# Patient Record
Sex: Male | Born: 1960 | Race: Black or African American | Hispanic: No | Marital: Married | Smoking: Current every day smoker
Health system: Southern US, Community
[De-identification: ages and names within clinical notes are randomized; demographics above are authoritative.]

## PROBLEM LIST (undated history)

## (undated) DIAGNOSIS — I10 Essential (primary) hypertension: Secondary | ICD-10-CM

## (undated) DIAGNOSIS — F419 Anxiety disorder, unspecified: Secondary | ICD-10-CM

---

## 2002-09-28 ENCOUNTER — Emergency Department (HOSPITAL_COMMUNITY): Admission: EM | Admit: 2002-09-28 | Discharge: 2002-09-28 | Payer: Self-pay | Admitting: Emergency Medicine

## 2003-07-05 ENCOUNTER — Emergency Department (HOSPITAL_COMMUNITY): Admission: EM | Admit: 2003-07-05 | Discharge: 2003-07-05 | Payer: Self-pay | Admitting: Emergency Medicine

## 2007-05-08 ENCOUNTER — Emergency Department (HOSPITAL_COMMUNITY): Admission: EM | Admit: 2007-05-08 | Discharge: 2007-05-08 | Payer: Self-pay | Admitting: Emergency Medicine

## 2008-10-13 ENCOUNTER — Emergency Department (HOSPITAL_COMMUNITY): Admission: EM | Admit: 2008-10-13 | Discharge: 2008-10-13 | Payer: Self-pay | Admitting: *Deleted

## 2008-11-14 ENCOUNTER — Emergency Department (HOSPITAL_COMMUNITY): Admission: EM | Admit: 2008-11-14 | Discharge: 2008-11-14 | Payer: Self-pay | Admitting: Emergency Medicine

## 2009-02-14 ENCOUNTER — Emergency Department (HOSPITAL_COMMUNITY): Admission: EM | Admit: 2009-02-14 | Discharge: 2009-02-14 | Payer: Self-pay | Admitting: Family Medicine

## 2011-10-01 LAB — BASIC METABOLIC PANEL
BUN: 8
Calcium: 9.5
Creatinine, Ser: 1.1
GFR calc Af Amer: >60
GFR calc non Af Amer: >60
Glucose, Bld: 104 — ABNORMAL HIGH
Potassium: 3.9

## 2011-10-01 LAB — DIFFERENTIAL
Lymphocytes Relative: 29
Monocytes Absolute: 0.4
Monocytes Relative: 7
Neutro Abs: 3

## 2011-10-01 LAB — CBC
HCT: 46
Hemoglobin: 15.3
RBC: 5.1
RDW: 13.4

## 2014-01-06 ENCOUNTER — Emergency Department (HOSPITAL_COMMUNITY): Admission: EM | Admit: 2014-01-06 | Discharge: 2014-01-06 | Discharge status: Against Medical Advice | Payer: Self-pay

## 2014-10-18 ENCOUNTER — Emergency Department (HOSPITAL_COMMUNITY): Payer: Commercial Managed Care - PPO

## 2014-10-18 ENCOUNTER — Emergency Department (HOSPITAL_COMMUNITY)
Admission: EM | Admit: 2014-10-18 | Discharge: 2014-10-18 | Disposition: A | Discharge status: Home / Self Care | Payer: Commercial Managed Care - PPO | Attending: Emergency Medicine | Admitting: Emergency Medicine

## 2014-10-18 ENCOUNTER — Encounter (HOSPITAL_COMMUNITY): Payer: Self-pay | Admitting: Emergency Medicine

## 2014-10-18 DIAGNOSIS — I10 Essential (primary) hypertension: Secondary | ICD-10-CM | POA: Insufficient documentation

## 2014-10-18 DIAGNOSIS — M542 Cervicalgia: Secondary | ICD-10-CM | POA: Diagnosis not present

## 2014-10-18 DIAGNOSIS — Z720 Tobacco use: Secondary | ICD-10-CM | POA: Diagnosis not present

## 2014-10-18 DIAGNOSIS — M5410 Radiculopathy, site unspecified: Secondary | ICD-10-CM | POA: Diagnosis not present

## 2014-10-18 DIAGNOSIS — R200 Anesthesia of skin: Secondary | ICD-10-CM | POA: Diagnosis present

## 2014-10-18 DIAGNOSIS — Z79899 Other long term (current) drug therapy: Secondary | ICD-10-CM | POA: Insufficient documentation

## 2014-10-18 HISTORY — DX: Essential (primary) hypertension: I10

## 2014-10-18 LAB — COMPREHENSIVE METABOLIC PANEL
ALK PHOS: 75 U/L (ref 39–117)
ALT: 21 U/L (ref 0–53)
AST: 20 U/L (ref 0–37)
Albumin: 4.2 g/dL (ref 3.5–5.2)
Anion gap: 14 (ref 5–15)
BILIRUBIN TOTAL: 0.3 mg/dL (ref 0.3–1.2)
BUN: 12 mg/dL (ref 6–23)
CO2: 26 mEq/L (ref 19–32)
Calcium: 10.2 mg/dL (ref 8.4–10.5)
Chloride: 101 mEq/L (ref 96–112)
Creatinine, Ser: 1.19 mg/dL (ref 0.50–1.35)
GFR calc Af Amer: 78 mL/min — ABNORMAL LOW (ref >90)
GFR, EST NON AFRICAN AMERICAN: 68 mL/min — AB (ref >90)
GLUCOSE: 117 mg/dL — AB (ref 70–99)
POTASSIUM: 4.1 meq/L (ref 3.7–5.3)
Sodium: 141 mEq/L (ref 137–147)
TOTAL PROTEIN: 8.1 g/dL (ref 6.0–8.3)

## 2014-10-18 LAB — CBC WITH DIFFERENTIAL/PLATELET
Basophils Absolute: 0 10*3/uL (ref 0.0–0.1)
Basophils Relative: 0 % (ref 0–1)
Eosinophils Absolute: 0.3 10*3/uL (ref 0.0–0.7)
Eosinophils Relative: 6 % — ABNORMAL HIGH (ref 0–5)
HEMATOCRIT: 46.4 % (ref 39.0–52.0)
HEMOGLOBIN: 15.5 g/dL (ref 13.0–17.0)
Lymphocytes Relative: 27 % (ref 12–46)
Lymphs Abs: 1.6 10*3/uL (ref 0.7–4.0)
MCH: 30 pg (ref 26.0–34.0)
MCHC: 33.4 g/dL (ref 30.0–36.0)
MCV: 89.9 fL (ref 78.0–100.0)
MONOS PCT: 7 % (ref 3–12)
Monocytes Absolute: 0.4 10*3/uL (ref 0.1–1.0)
NEUTROS PCT: 60 % (ref 43–77)
Neutro Abs: 3.4 10*3/uL (ref 1.7–7.7)
PLATELETS: 272 10*3/uL (ref 150–400)
RBC: 5.16 MIL/uL (ref 4.22–5.81)
RDW: 13.5 % (ref 11.5–15.5)
WBC: 5.7 10*3/uL (ref 4.0–10.5)

## 2014-10-18 LAB — I-STAT TROPONIN, ED: Troponin i, poc: 0.01 ng/mL (ref 0.00–0.08)

## 2014-10-18 LAB — TROPONIN I

## 2014-10-18 MED ORDER — HYDROCODONE-ACETAMINOPHEN 5-325 MG PO TABS: 2.0000 | ORAL_TABLET | ORAL | Status: AC | PRN

## 2014-10-18 MED ORDER — PREDNISONE 10 MG PO TABS: ORAL_TABLET | ORAL | Status: AC

## 2014-10-18 NOTE — ED Provider Notes (Signed)
Medical screening examination/treatment/procedure(s) were performed by non-physician practitioner and as supervising physician I was immediately available for consultation/collaboration.   EKG Interpretation None      Devoria AlbeIva Rahn Lacuesta, MD, Armando GangFACEP   Ward GivensIva L Josey Dettmann, MD 10/18/14 [id number]

## 2014-10-18 NOTE — Discharge Instructions (Signed)

## 2014-10-18 NOTE — ED Provider Notes (Signed)
CSN: [id number]     Arrival date & time 10/18/14  1002 History   First MD Initiated Contact with Patient 10/18/14 1116     Chief Complaint  Patient presents with  . Numbness     (Consider location/radiation/quality/duration/timing/severity/associated sxs/prior Treatment) Patient is a 53 y.o. male presenting with neurologic complaint. The history is provided by the patient. No language interpreter was used.  Neurologic Problem This is a new problem. The current episode started today. The problem occurs constantly. The problem has been unchanged. Associated symptoms include neck pain and numbness. Pertinent negatives include no diaphoresis, fatigue, headaches, nausea, vertigo, visual change, vomiting or weakness. Nothing aggravates the symptoms. He has tried nothing for the symptoms. The treatment provided no relief.   Pt had numbness to left arm a week ago that resolved.   Pt reports he thought he slept wrong.   Pt reports he noticed tingling and numbness to right arm last pm.   Pt reports he had trouble sleeping last night.  Pt reports some tightness in neck.  No weakness, Past Medical History  Diagnosis Date  . Hypertension    History reviewed. No pertinent past surgical history. History reviewed. No pertinent family history. History  Substance Use Topics  . Smoking status: Current Every Day Smoker  . Smokeless tobacco: Not on file  . Alcohol Use: No    Review of Systems  Constitutional: Negative for diaphoresis and fatigue.  Gastrointestinal: Negative for nausea and vomiting.  Musculoskeletal: Positive for neck pain.  Neurological: Positive for numbness. Negative for vertigo, weakness and headaches.  All other systems reviewed and are negative.     Allergies  Review of patient's allergies indicates no known allergies.  Home Medications   Prior to Admission medications   Medication Sig Start Date End Date Taking? Authorizing Provider  amLODipine (NORVASC) 10 MG tablet  Take 10 mg by mouth daily.   Yes Historical Provider, MD  hydrochlorothiazide (HYDRODIURIL) 25 MG tablet Take 25 mg by mouth daily.   Yes Historical Provider, MD  ibuprofen (ADVIL,MOTRIN) 200 MG tablet Take 600 mg by mouth every 6 (six) hours as needed for headache.   Yes Historical Provider, MD  losartan (COZAAR) 50 MG tablet Take 50 mg by mouth daily.   Yes Historical Provider, MD  lovastatin (MEVACOR) 40 MG tablet Take 40 mg by mouth at bedtime.   Yes Historical Provider, MD   BP 133/82  Pulse 66  Temp(Src) 97.5 F (36.4 C) (Oral)  Resp 18  Ht 5\' 11"  (1.803 m)  Wt 260 lb (117.935 kg)  BMI 36.28 kg/m2  SpO2 95% Physical Exam  Nursing note and vitals reviewed. Constitutional: He is oriented to person, place, and time. He appears well-developed and well-nourished.  HENT:  Head: Normocephalic.  Right Ear: External ear normal.  Left Ear: External ear normal.  Nose: Nose normal.  Mouth/Throat: Oropharynx is clear and moist.  Eyes: Conjunctivae and EOM are normal. Pupils are equal, round, and reactive to light.  Neck: Normal range of motion. Neck supple.  Cardiovascular: Normal rate and normal heart sounds.   Pulmonary/Chest: Effort normal and breath sounds normal.  Abdominal: Soft.  Musculoskeletal: Normal range of motion.  Tingling in area of shoulder upper arm to mid forearm normal strength  Neurological: He is alert and oriented to person, place, and time. He has normal reflexes.  Skin: Skin is warm.  Psychiatric: He has a normal mood and affect.    ED Course  Procedures (including critical care time) Labs  Review Labs Reviewed  CBC WITH DIFFERENTIAL - Abnormal; Notable for the following:    Eosinophils Relative 6 (*)    All other components within normal limits  COMPREHENSIVE METABOLIC PANEL - Abnormal; Notable for the following:    Glucose, Bld 117 (*)    GFR calc non Af Amer 68 (*)    GFR calc Af Amer 78 (*)    All other components within normal limits  TROPONIN I   I-STAT TROPOININ, ED    Imaging Review Dg Chest 2 View  10/18/2014   CLINICAL DATA:  Two-day history of right arm numbness ; history of hypertension and diabetes  EXAM: CHEST  2 VIEW  COMPARISON:  Portable chest x-ray of October 13, 2008.  FINDINGS: The lungs are adequately inflated. There is no focal infiltrate. The cardiac silhouette is top-normal in size. The pulmonary vascularity is not engorged. The mediastinum is normal in width. There is no pleural effusion or pneumothorax. The observed bony thorax is unremarkable.  IMPRESSION: There is no acute cardiopulmonary abnormality.   Electronically Signed   By: David  [place]   On: 10/18/2014 11:20   Ct Head Wo Contrast  10/18/2014   CLINICAL DATA:  Onset of right-sided arm numbness last night.  EXAM: CT HEAD WITHOUT CONTRAST  CT CERVICAL SPINE WITHOUT CONTRAST  TECHNIQUE: Multidetector CT imaging of the head and cervical spine was performed following the standard protocol without intravenous contrast. Multiplanar CT image reconstructions of the cervical spine were also generated.  COMPARISON:  None.  FINDINGS: CT HEAD FINDINGS  The ventricles are normal in size and configuration. No extra-axial fluid collections are identified. The gray-white differentiation is normal. No CT findings for acute intracranial process such as hemorrhage or infarction. No mass lesions. The brainstem and cerebellum are grossly normal.  The bony structures are intact. The paranasal sinuses and mastoid air cells are clear. The globes are intact.  CT CERVICAL SPINE FINDINGS  Exam is limited by body habitus and some motion artifact. Reversal of the normal cervical lordosis likely due to the head for deposition. Normal alignment of the cervical vertebral bodies. Disc spaces and vertebral bodies are maintained. Moderate degenerative disc disease and facet disease. The disc disease is most significant at C4-5, C5-6 and C6-7 and the facet disease is more prevalent in the upper cervical  spine. No acute fracture or abnormal prevertebral soft tissue swelling. The facets are normally aligned. The skullbase C1 and C1-2 articulations are maintained. The dens is normal. No large disc protrusions, spinal or foraminal stenosis. The lung apices are clear.  IMPRESSION: 1. No acute intracranial findings or mass lesion. 2. Degenerative cervical spondylosis with multilevel disc disease and facet disease but no acute fractures identified. 3. Mild right foraminal stenosis noted at C3-4 due to severe right-sided facet disease.   Electronically Signed   By: Loralie Champagne M.D.   On: 10/18/2014 13:56   Ct Cervical Spine Wo Contrast  10/18/2014   CLINICAL DATA:  Onset of right-sided arm numbness last night.  EXAM: CT HEAD WITHOUT CONTRAST  CT CERVICAL SPINE WITHOUT CONTRAST  TECHNIQUE: Multidetector CT imaging of the head and cervical spine was performed following the standard protocol without intravenous contrast. Multiplanar CT image reconstructions of the cervical spine were also generated.  COMPARISON:  None.  FINDINGS: CT HEAD FINDINGS  The ventricles are normal in size and configuration. No extra-axial fluid collections are identified. The gray-white differentiation is normal. No CT findings for acute intracranial process such as hemorrhage or infarction.  No mass lesions. The brainstem and cerebellum are grossly normal.  The bony structures are intact. The paranasal sinuses and mastoid air cells are clear. The globes are intact.  CT CERVICAL SPINE FINDINGS  Exam is limited by body habitus and some motion artifact. Reversal of the normal cervical lordosis likely due to the head for deposition. Normal alignment of the cervical vertebral bodies. Disc spaces and vertebral bodies are maintained. Moderate degenerative disc disease and facet disease. The disc disease is most significant at C4-5, C5-6 and C6-7 and the facet disease is more prevalent in the upper cervical spine. No acute fracture or abnormal  prevertebral soft tissue swelling. The facets are normally aligned. The skullbase C1 and C1-2 articulations are maintained. The dens is normal. No large disc protrusions, spinal or foraminal stenosis. The lung apices are clear.  IMPRESSION: 1. No acute intracranial findings or mass lesion. 2. Degenerative cervical spondylosis with multilevel disc disease and facet disease but no acute fractures identified. 3. Mild right foraminal stenosis noted at C3-4 due to severe right-sided facet disease.   Electronically Signed   By: Loralie ChampagneMark  Gallerani M.D.   On: 10/18/2014 13:56     EKG Interpretation None      MDM  Pt's symptoms sound like radiculopathy from his neck.    Ct head is normal   Ct spine shows foriminal stenosis at c3-4 level,  Which seems to be in the distribution of tingling.   I will treat with prednisone and hydrocodone.   Pt advised to see Dr. Gerlene FeeKritzer for evaluation.   Final diagnoses:  Radiculopathy of arm    Follow up with Dr. Gerlene FeeKritzer for recheck AVS    Elson AreasLeslie K Zachry Hopfensperger, PA-C 10/18/14 1514  Lonia SkinnerLeslie K Coon RapidsSofia, [place] JerseyPA-C 10/18/14 1515

## 2014-10-18 NOTE — ED Notes (Signed)
Spoke with CT stated patient is next to be transported.

## 2014-10-18 NOTE — ED Notes (Signed)
Pt in c/o right arm numbness and tingling, reports intermittent pain shooting down to his right hand, states this first happened two days ago and the symptoms at that time were in his left arm, they have since moved to right arm, denies chest pain, no distress noted.

## 2015-02-08 ENCOUNTER — Emergency Department (HOSPITAL_COMMUNITY)
Admission: EM | Admit: 2015-02-08 | Discharge: 2015-02-08 | Disposition: A | Discharge status: Home / Self Care | Payer: Commercial Managed Care - PPO | Attending: Emergency Medicine | Admitting: Emergency Medicine

## 2015-02-08 ENCOUNTER — Encounter (HOSPITAL_COMMUNITY): Payer: Self-pay

## 2015-02-08 DIAGNOSIS — E669 Obesity, unspecified: Secondary | ICD-10-CM | POA: Insufficient documentation

## 2015-02-08 DIAGNOSIS — R112 Nausea with vomiting, unspecified: Secondary | ICD-10-CM | POA: Insufficient documentation

## 2015-02-08 DIAGNOSIS — Z7952 Long term (current) use of systemic steroids: Secondary | ICD-10-CM | POA: Insufficient documentation

## 2015-02-08 DIAGNOSIS — H578 Other specified disorders of eye and adnexa: Secondary | ICD-10-CM | POA: Diagnosis not present

## 2015-02-08 DIAGNOSIS — R5383 Other fatigue: Secondary | ICD-10-CM | POA: Diagnosis not present

## 2015-02-08 DIAGNOSIS — R000 Tachycardia, unspecified: Secondary | ICD-10-CM | POA: Insufficient documentation

## 2015-02-08 DIAGNOSIS — R69 Illness, unspecified: Secondary | ICD-10-CM

## 2015-02-08 DIAGNOSIS — Z79899 Other long term (current) drug therapy: Secondary | ICD-10-CM | POA: Insufficient documentation

## 2015-02-08 DIAGNOSIS — R509 Fever, unspecified: Secondary | ICD-10-CM | POA: Insufficient documentation

## 2015-02-08 DIAGNOSIS — M791 Myalgia: Secondary | ICD-10-CM | POA: Diagnosis not present

## 2015-02-08 DIAGNOSIS — J111 Influenza due to unidentified influenza virus with other respiratory manifestations: Secondary | ICD-10-CM

## 2015-02-08 DIAGNOSIS — R197 Diarrhea, unspecified: Secondary | ICD-10-CM | POA: Insufficient documentation

## 2015-02-08 DIAGNOSIS — Z720 Tobacco use: Secondary | ICD-10-CM | POA: Diagnosis not present

## 2015-02-08 LAB — COMPREHENSIVE METABOLIC PANEL
ALK PHOS: 81 U/L (ref 39–117)
ALT: 21 U/L (ref 0–53)
AST: 26 U/L (ref 0–37)
Albumin: 4.2 g/dL (ref 3.5–5.2)
Anion gap: 12 (ref 5–15)
BUN: 8 mg/dL (ref 6–23)
CALCIUM: 8.9 mg/dL (ref 8.4–10.5)
CHLORIDE: 103 mmol/L (ref 96–112)
CO2: 26 mmol/L (ref 19–32)
Creatinine, Ser: 1.41 mg/dL — ABNORMAL HIGH (ref 0.50–1.35)
GFR calc Af Amer: 63 mL/min — ABNORMAL LOW (ref >90)
GFR, EST NON AFRICAN AMERICAN: 55 mL/min — AB (ref >90)
Glucose, Bld: 91 mg/dL (ref 70–99)
POTASSIUM: 3.5 mmol/L (ref 3.5–5.1)
SODIUM: 141 mmol/L (ref 135–145)
Total Bilirubin: 0.5 mg/dL (ref 0.3–1.2)
Total Protein: 7.7 g/dL (ref 6.0–8.3)

## 2015-02-08 LAB — CBC WITH DIFFERENTIAL/PLATELET
Basophils Absolute: 0 10*3/uL (ref 0.0–0.1)
Basophils Relative: 0 % (ref 0–1)
Eosinophils Absolute: 0.1 10*3/uL (ref 0.0–0.7)
Eosinophils Relative: 1 % (ref 0–5)
HEMATOCRIT: 45.8 % (ref 39.0–52.0)
HEMOGLOBIN: 15.5 g/dL (ref 13.0–17.0)
LYMPHS PCT: 8 % — AB (ref 12–46)
Lymphs Abs: 0.4 10*3/uL — ABNORMAL LOW (ref 0.7–4.0)
MCH: 30.1 pg (ref 26.0–34.0)
MCHC: 33.8 g/dL (ref 30.0–36.0)
MCV: 88.9 fL (ref 78.0–100.0)
MONO ABS: 0 10*3/uL — AB (ref 0.1–1.0)
MONOS PCT: 1 % — AB (ref 3–12)
NEUTROS ABS: 5.1 10*3/uL (ref 1.7–7.7)
Neutrophils Relative %: 90 % — ABNORMAL HIGH (ref 43–77)
Platelets: 195 10*3/uL (ref 150–400)
RBC: 5.15 MIL/uL (ref 4.22–5.81)
RDW: 13.3 % (ref 11.5–15.5)
WBC: 5.6 10*3/uL (ref 4.0–10.5)

## 2015-02-08 LAB — LIPASE, BLOOD: Lipase: 28 U/L (ref 11–59)

## 2015-02-08 MED ORDER — IBUPROFEN 400 MG PO TABS
600.0000 mg | ORAL_TABLET | Freq: Once | ORAL | Status: AC
  Administered 2015-02-08: 600 mg via ORAL
  Filled 2015-02-08 (×2): qty 1

## 2015-02-08 MED ORDER — ACETAMINOPHEN 325 MG PO TABS
650.0000 mg | ORAL_TABLET | Freq: Four times a day (QID) | ORAL | Status: DC | PRN
  Administered 2015-02-08: 650 mg via ORAL
  Filled 2015-02-08: qty 2

## 2015-02-08 MED ORDER — ONDANSETRON HCL 4 MG PO TABS: 4.0000 mg | ORAL_TABLET | Freq: Three times a day (TID) | ORAL | Status: AC | PRN

## 2015-02-08 MED ORDER — ONDANSETRON 4 MG PO TBDP
ORAL_TABLET | ORAL | Status: AC
  Filled 2015-02-08: qty 2

## 2015-02-08 MED ORDER — ONDANSETRON 4 MG PO TBDP
8.0000 mg | ORAL_TABLET | Freq: Once | ORAL | Status: AC
  Administered 2015-02-08: 8 mg via ORAL

## 2015-02-08 NOTE — ED Provider Notes (Signed)
CSN: [id number]     Arrival date & time 02/08/15  1250 History   First MD Initiated Contact with Patient 02/08/15 1526     Chief Complaint  Patient presents with  . Nausea  . Vomiting  . Diarrhea     (Consider location/radiation/quality/duration/timing/severity/associated sxs/prior Treatment) HPI Comments: Pt is a 54 y.o. male with nonsignficant PMH whom presents to the ED for 3 day history of generalized body aches, fever, nausea, vomiting and mild upper respiratory symptoms. Pt states he first noticed feeling off on Sunday after standing outside for a while and coming back in; no specific complaint other than feeling a little warm. He developed muscle aches that night which have continued. Monday evening he developed some nausea, and Tuesday had several episodes of watery diarrhea and vomiting. Diarrhea is not continuous, only had about 4 episodes, noted blood in first BM but says he has a hemorrhoid and no further bleeding after that. Vomit is food contents. He has some nausea but has been able to keep some foods/liquids down, hasn't drank much over the past 24 hours. He does not know any specific sick contacts, but states a coworker is currently waiting in the ER for abdominal pain and he sees a lot of people at his work. He did not get a flu shot this year. Since coming to the ED the zofran has helped his nausea, and he overall feels a little better, declining to have an IV placed or have more blood drawn.  Patient is a 54 y.o. male presenting with diarrhea. The history is provided by the patient. No language interpreter was used.  Diarrhea Quality:  Watery Severity:  Mild Onset quality:  Sudden Number of episodes:  4 Duration:  2 days Timing:  Sporadic Progression:  Unchanged Relieved by:  Nothing Worsened by:  Nothing tried Ineffective treatments:  None tried Associated symptoms: chills, fever, myalgias, URI and vomiting   Associated symptoms: no abdominal pain, no recent cough and  no headaches   Fever:    Duration:  2 days   Timing:  Intermittent   Max temp PTA (F):  102   Temp source:  Oral   Progression:  Unchanged Myalgias:    Location:  Generalized   Quality:  Aching   Onset quality:  Gradual   Duration:  2 days   Timing:  Constant   Progression:  Unchanged Vomiting:    Quality:  Stomach contents   Number of occurrences:  2   Severity:  Mild   Duration:  2 days   Timing:  Sporadic   Progression:  Unchanged Risk factors: sick contacts   Risk factors: no recent antibiotic use and no suspicious food intake     Past Medical History  Diagnosis Date  . Hypertension    History reviewed. No pertinent past surgical history. History reviewed. No pertinent family history. History  Substance Use Topics  . Smoking status: Current Every Day Smoker  . Smokeless tobacco: Not on file  . Alcohol Use: No    Review of Systems  Constitutional: Positive for fever, chills and fatigue.  HENT: Positive for rhinorrhea. Negative for sore throat.   Eyes: Negative for visual disturbance.  Respiratory: Negative for shortness of breath.   Cardiovascular: Negative for chest pain and leg swelling.  Gastrointestinal: Positive for nausea, vomiting and diarrhea. Negative for abdominal pain, constipation and blood in stool.  Genitourinary: Negative for dysuria and urgency.  Musculoskeletal: Positive for myalgias.  Skin: Negative for rash.  Neurological: Negative  for light-headedness and headaches.  Psychiatric/Behavioral: Negative for confusion.  All other systems reviewed and are negative.     Allergies  Review of patient's allergies indicates no known allergies.  Home Medications   Prior to Admission medications   Medication Sig Start Date End Date Taking? Authorizing Provider  amLODipine (NORVASC) 10 MG tablet Take 10 mg by mouth daily.   Yes Historical Provider, MD  hydrochlorothiazide (HYDRODIURIL) 25 MG tablet Take 25 mg by mouth daily.   Yes Historical  Provider, MD  ibuprofen (ADVIL,MOTRIN) 200 MG tablet Take 600 mg by mouth every 6 (six) hours as needed for headache.   Yes Historical Provider, MD  losartan (COZAAR) 50 MG tablet Take 50 mg by mouth daily.   Yes Historical Provider, MD  HYDROcodone-acetaminophen (NORCO/VICODIN) 5-325 MG per tablet Take 2 tablets by mouth every 4 (four) hours as needed. Patient not taking: Reported on 02/08/2015 10/18/14   Elson AreasLeslie K Sofia, PA-C  ondansetron (ZOFRAN) 4 MG tablet Take 1 tablet (4 mg total) by mouth every 8 (eight) hours as needed for nausea or vomiting. 02/08/15   Nani RavensAndrew M Francy Mcilvaine, MD  predniSONE (DELTASONE) 10 MG tablet 6,5,4,3,2,1 Patient not taking: Reported on 02/08/2015 10/18/14   Elson AreasLeslie K Sofia, PA-C   BP 149/93 mmHg  Pulse 122  Temp(Src) 102.1 F (38.9 C) (Oral)  Resp 18  SpO2 96% Physical Exam  Constitutional: He is oriented to person, place, and time. He appears well-developed and well-nourished. No distress.  HENT:  Head: Normocephalic.    Eyes: EOM are normal. Pupils are equal, round, and reactive to light. Right conjunctiva has a hemorrhage.    Neck: Normal range of motion.  Cardiovascular: Regular rhythm, normal heart sounds and intact distal pulses.  Tachycardia present.  Exam reveals no gallop and no friction rub.   No murmur heard. Pulmonary/Chest: Effort normal and breath sounds normal. No respiratory distress. He has no wheezes. He has no rales.  Abdominal: Soft. He exhibits no distension. There is no tenderness. There is no rebound and no guarding.  obese  Musculoskeletal: He exhibits no edema or tenderness.  Neurological: He is alert and oriented to person, place, and time. No cranial nerve deficit.  Skin: Skin is warm and dry. No rash noted.  Psychiatric: He has a normal mood and affect. His behavior is normal. Judgment and thought content normal.  Nursing note and vitals reviewed.   ED Course  Procedures (including critical care time) Labs Review Labs Reviewed    CBC WITH DIFFERENTIAL/PLATELET - Abnormal; Notable for the following:    Neutrophils Relative % 90 (*)    Lymphocytes Relative 8 (*)    Lymphs Abs 0.4 (*)    Monocytes Relative 1 (*)    Monocytes Absolute 0.0 (*)    All other components within normal limits  COMPREHENSIVE METABOLIC PANEL - Abnormal; Notable for the following:    Creatinine, Ser 1.41 (*)    GFR calc non Af Amer 55 (*)    GFR calc Af Amer 63 (*)    All other components within normal limits  LIPASE, BLOOD  URINALYSIS, ROUTINE W REFLEX MICROSCOPIC    Imaging Review No results found.   EKG Interpretation None      MDM   Final diagnoses:  Influenza-like illness   Pt overall appearing well despite having high fever 102F and tachycardia. Tachycardia could be explained by his fever and some dehydration, pt declines IV fluids but will try oral challenge. Mild AKI could also be explained by  dehydration. Likely viral infection, will forego additional workup at this time and pt wants to let it run its course, return precautions for worsening fever unrelieved by tylenol/motrin, cough or SOB.   Tachycardia improved after fever brought down, tolerated oral challenge. Stable for discharge.  Nani Ravens, MD 02/08/15 1652  Enid Skeens, MD 02/08/15 [phone]

## 2015-02-08 NOTE — ED Notes (Signed)
Pt refusing IV and blood work at this time

## 2015-02-08 NOTE — ED Notes (Signed)
Pt from home with nausea vomiting diarrhea since last night.  Emesis x 1, diarrhea x 4.   Chills reported at home.

## 2015-02-08 NOTE — Discharge Instructions (Signed)
You likely have a viral infection, which could be the flu or another virus. Continue to take tylenol and motrin to help with your fevers and any pain.   Tylenol 500mg  every 4 hours or 650mg  every 6 hours for fever Motrin/ibuprofen 600mg  every 6 hours for pain/fever  Make sure you drink plenty of water to stay hydrated.   No Primary Care Doctor:  Call [facility] at [phone] - they can help you locate a primary care doctor that accepts your insurance, provides certain services, etc.  Physician Referral [phone] Medication Assistance:  Organization Address Phone Notes  [facility]  [address]  [place], [place] [address]  [phone]  --Must be a resident of [facility] [place]  -- Must have NO insurance coverage whatsoever (no Medicaid/ Medicare, etc.)  -- The pt. MUST have a primary care doctor that directs their care regularly and follows them in the community   [facility]   [phone]    [employer]   [phone]    Agencies that provide inexpensive medical care:  Organization Address Phone Notes  [facility]   [phone]    [facility]   [phone]    [facility]  [address]  [place], [place] [address]  [phone]    [facility]  [address],  [place]  [phone]    [facility]   [phone]    [facility]   [phone]    [facility]  [address], [place]  Phone: [phone], Fax: [phone]  Hours of Operation: 9 am - 6 pm, M-F. Also accepts Medicaid/Medicare and self-pay.   [facility]  [address], [place]  Phone: [phone], Fax: [phone].  Hours of Operation: 8:30 am - 5:30 pm, M-F. Also accepts Medicaid and self-pay.   [facility] [place]  [address], [place]  Phone: [phone]) [phone]     [facility]  [address], [place], [place]  [phone], Ext. [phone]  Mondays & Thursdays: 7-9 AM. First 15 patients are seen on a first come, first serve basis.   Medicaid-accepting [facility] Providers:  Organization Address Phone Notes  [facility]  [address], [place]  [phone]) [phone]  Also accepts self-pay patients.   [facility]  [address], [place]  [phone]) [phone]    [facility]  [address], [place]  [phone]    [facility]  [address] [place] [address], [place]  [phone]    Renaye RakersVeita Bland  [address], [place]  [phone]  Only accepts [place] patients after they have their name applied to their card.   Self-Pay (no insurance) in [facility]:  Organization Address Phone Notes  [facility]  [address], [place]  [phone]    [facility]  [address], [place]  [phone]    [facility] [place]  [address], [place]  [phone]    [facility]/Dr. Osei-Bonsu  [address] [place] [address], [place] or [id number], [place]  [phone]  Phone  number for both [place] and [place] locations is the same.   [facility]  [address] [place]  [phone]    [facility]  [address], [place] or [address]  [phone]  [phone]    [facility]  [address] [place]  [phone], phone; [phone], fax   Sees patients 1st and 3rd Saturday of every month. Must not qualify for public or private insurance (i.e. Medicaid, Medicare, [place] [employer] Choice, Veterans' Benefits)   Household income should be no more than 200% of the poverty level The clinic cannot treat you if  you are pregnant or think you are pregnant  Sexually transmitted diseases are not treated at the clinic.

## 2016-01-08 IMAGING — CT CT CERVICAL SPINE W/O CM
4 of 5 series · 16 of 33 positions shown, 18 images · non-contrast
Comparison: None.

CLINICAL DATA: Onset of right-sided arm numbness last night.

EXAM:
CT HEAD WITHOUT CONTRAST
CT CERVICAL SPINE WITHOUT CONTRAST
TECHNIQUE: Multidetector CT imaging of the head and cervical spine was
performed following the standard protocol without intravenous
contrast. Multiplanar CT image reconstructions of the cervical spine
were also generated.

[Series 6: c_spine 2.0 i40s 3 · axial · 0.32mm/px · z∈[-344,-204]mm · 4 of 118 slices shown, 5 images]
[im 24/118  soft-tissue]
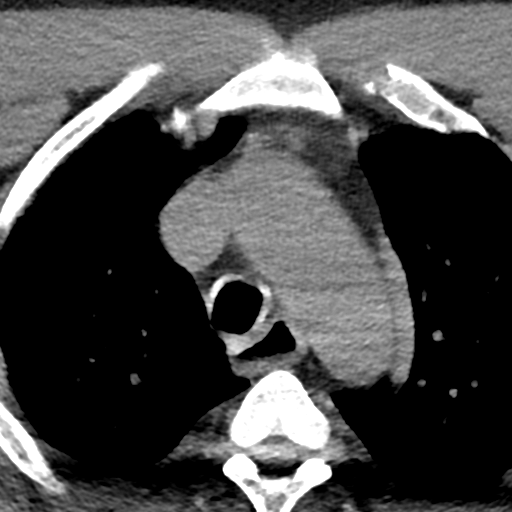
[im 24/118  bone]
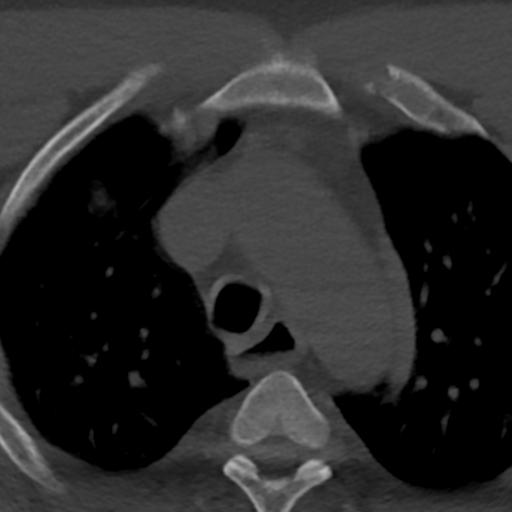
[im 47/118  bone]
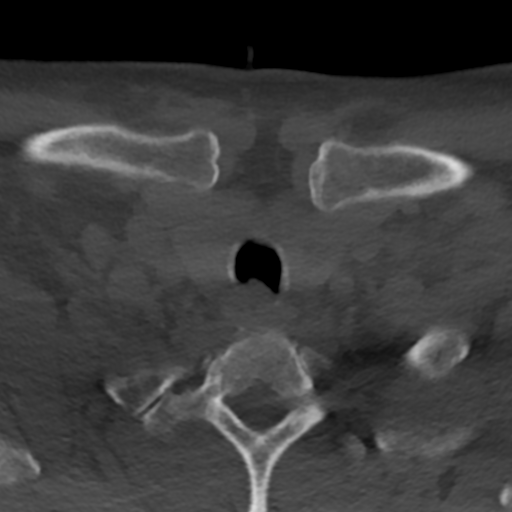
[im 71/118  bone]
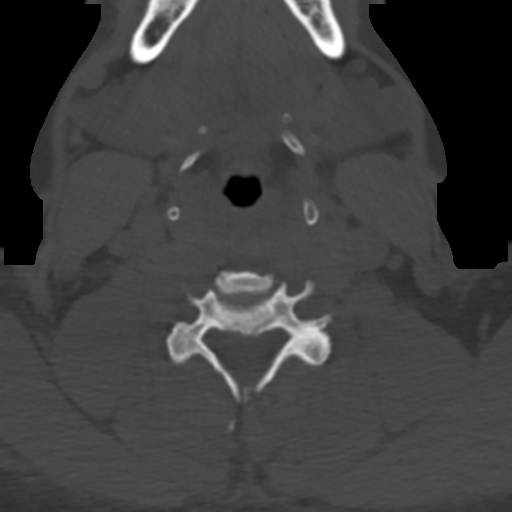
[im 94/118  bone]
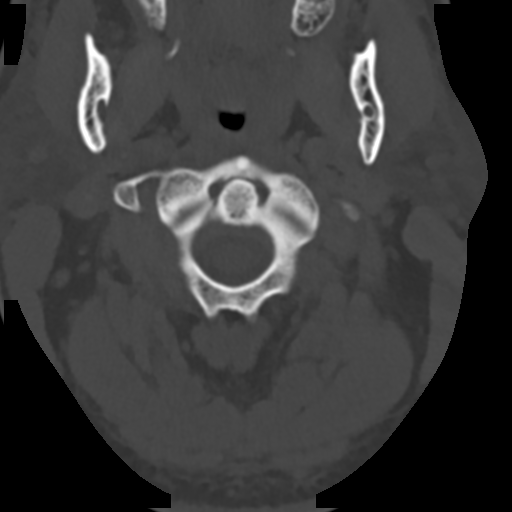

[Series 8: coronals · coronal · 0.37mm/px · 3 of 83 slices shown]
[im 17/83  bone]
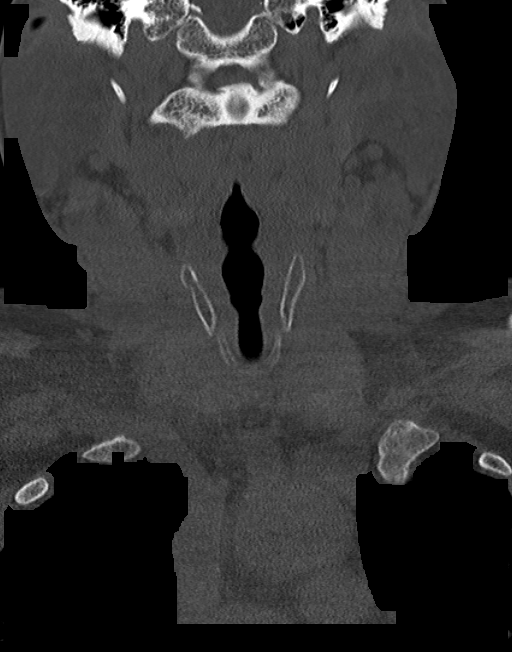
[im 33/83  bone]
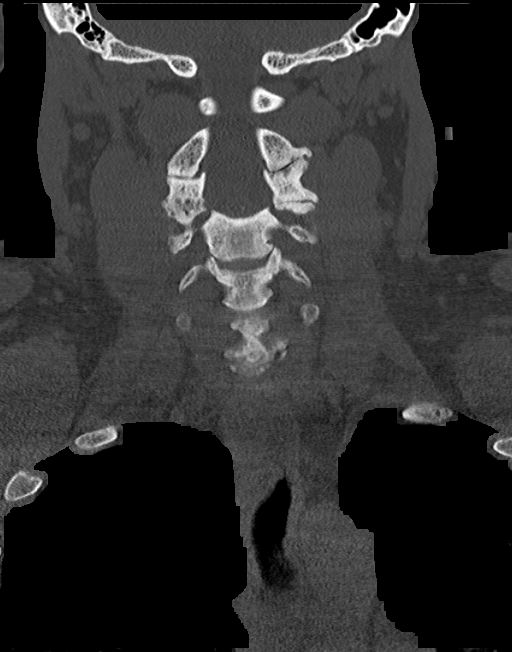
[im 50/83  bone]
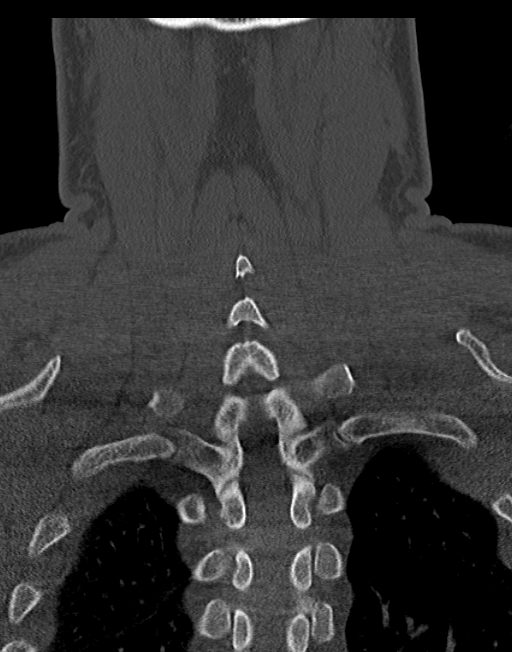

[Series 9: sagittals · sagittal · 0.46mm/px · 5 of 83 slices shown, 6 images]
[im 28/83  bone]
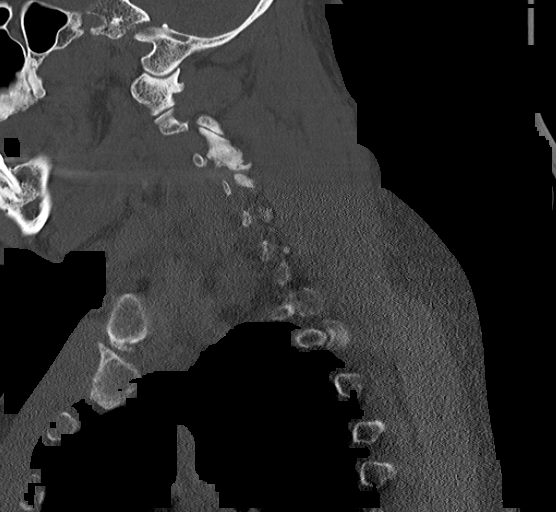
[im 35/83  bone]
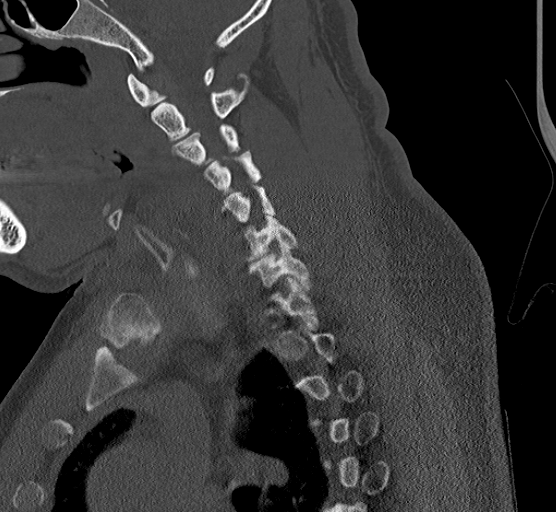
[im 42/83  soft-tissue]
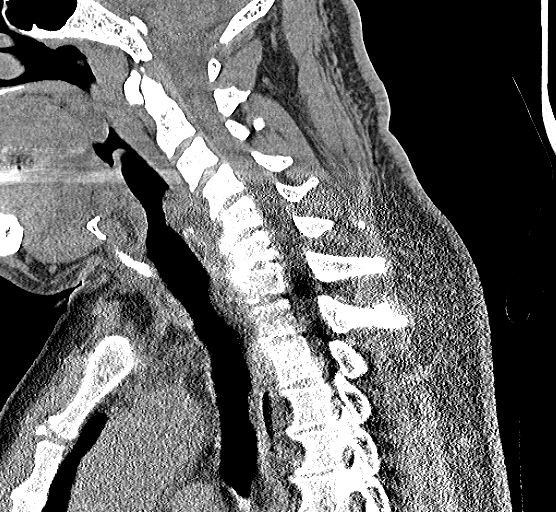
[im 42/83  bone]
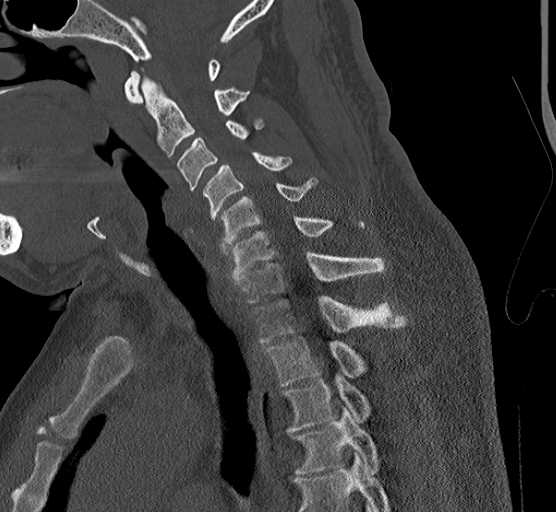
[im 48/83  bone]
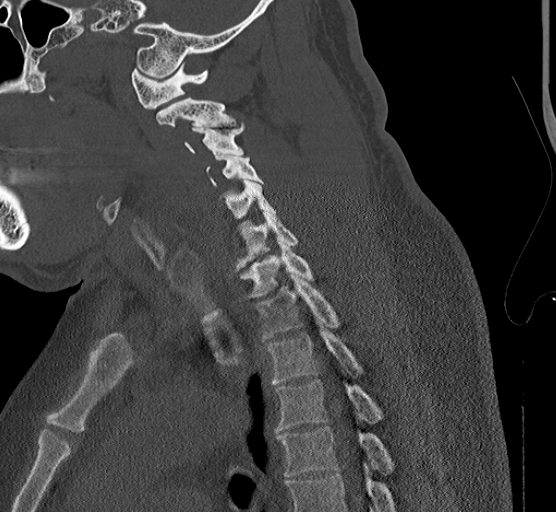
[im 55/83  bone]
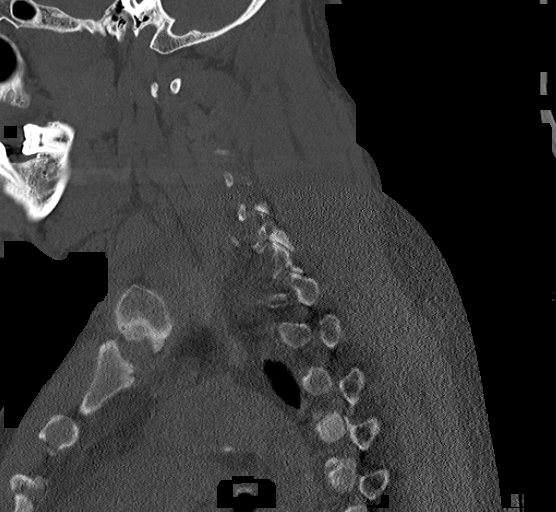

[Series 10: orthogonals · axial · 0.32mm/px · z∈[-354,-219]mm · 4 of 116 slices shown]
[im 24/116  bone]
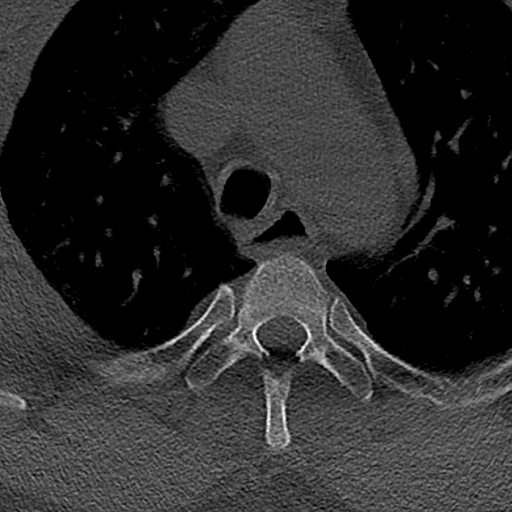
[im 47/116  bone]
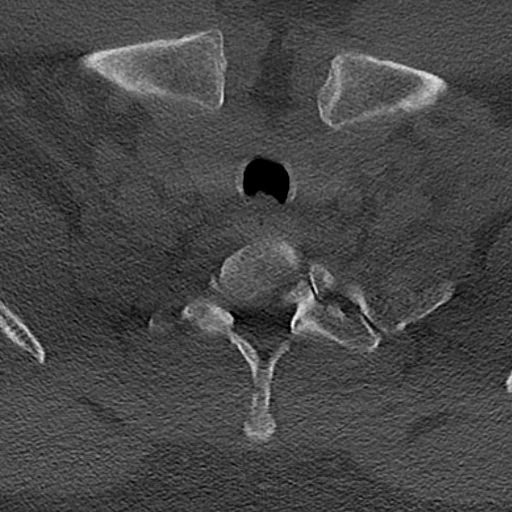
[im 70/116  bone]
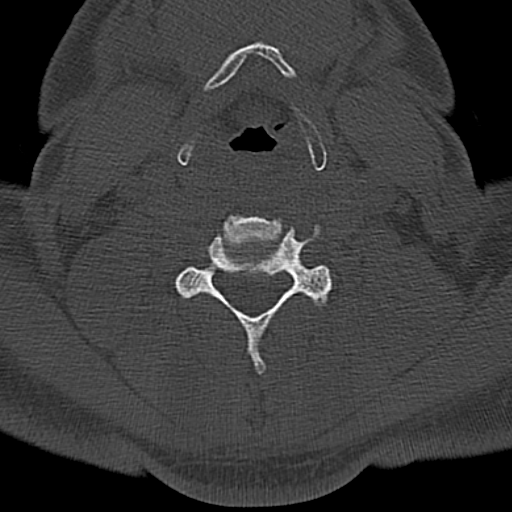
[im 93/116  bone]
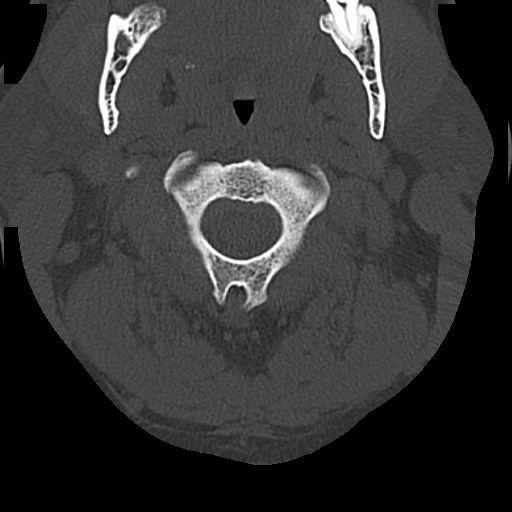

[16 of 33 positions shown; findings below may reference images not displayed]

FINDINGS: CT HEAD FINDINGS

The ventricles are normal in size and configuration. No extra-axial
fluid collections are identified. The gray-white differentiation is
normal. No CT findings for acute intracranial process such as
hemorrhage or infarction. No mass lesions. The brainstem and
cerebellum are grossly normal.

The bony structures are intact. The paranasal sinuses and mastoid
air cells are clear. The globes are intact.

CT CERVICAL SPINE FINDINGS

Exam is limited by body habitus and some motion artifact. Reversal
of the normal cervical lordosis likely due to the head for
deposition. Normal alignment of the cervical vertebral bodies. Disc
spaces and vertebral bodies are maintained. Moderate degenerative
disc disease and facet disease. The disc disease is most significant
at C4-5, C5-6 and C6-7 and the facet disease is more prevalent in
the upper cervical spine. No acute fracture or abnormal prevertebral
soft tissue swelling. The facets are normally aligned. The skullbase
C1 and C1-2 articulations are maintained. The dens is normal. No
large disc protrusions, spinal or foraminal stenosis. The lung
apices are clear.
IMPRESSION: 1. No acute intracranial findings or mass lesion.
2. Degenerative cervical spondylosis with multilevel disc disease
and facet disease but no acute fractures identified.
3. Mild right foraminal stenosis noted at C3-4 due to severe
right-sided facet disease.

## 2016-04-28 ENCOUNTER — Emergency Department (HOSPITAL_COMMUNITY)
Admission: EM | Admit: 2016-04-28 | Discharge: 2016-04-28 | Disposition: A | Discharge status: Home / Self Care | Payer: Commercial Managed Care - PPO | Attending: Emergency Medicine | Admitting: Emergency Medicine

## 2016-04-28 ENCOUNTER — Emergency Department (HOSPITAL_COMMUNITY): Payer: Commercial Managed Care - PPO

## 2016-04-28 ENCOUNTER — Encounter (HOSPITAL_COMMUNITY): Payer: Self-pay | Admitting: Neurology

## 2016-04-28 DIAGNOSIS — Y9289 Other specified places as the place of occurrence of the external cause: Secondary | ICD-10-CM | POA: Diagnosis not present

## 2016-04-28 DIAGNOSIS — Y998 Other external cause status: Secondary | ICD-10-CM | POA: Diagnosis not present

## 2016-04-28 DIAGNOSIS — Z79899 Other long term (current) drug therapy: Secondary | ICD-10-CM | POA: Diagnosis not present

## 2016-04-28 DIAGNOSIS — I10 Essential (primary) hypertension: Secondary | ICD-10-CM | POA: Diagnosis not present

## 2016-04-28 DIAGNOSIS — Z8659 Personal history of other mental and behavioral disorders: Secondary | ICD-10-CM | POA: Insufficient documentation

## 2016-04-28 DIAGNOSIS — S76311A Strain of muscle, fascia and tendon of the posterior muscle group at thigh level, right thigh, initial encounter: Secondary | ICD-10-CM | POA: Diagnosis not present

## 2016-04-28 DIAGNOSIS — F17200 Nicotine dependence, unspecified, uncomplicated: Secondary | ICD-10-CM | POA: Insufficient documentation

## 2016-04-28 DIAGNOSIS — S31119A Laceration without foreign body of abdominal wall, unspecified quadrant without penetration into peritoneal cavity, initial encounter: Secondary | ICD-10-CM

## 2016-04-28 DIAGNOSIS — Y9389 Activity, other specified: Secondary | ICD-10-CM | POA: Insufficient documentation

## 2016-04-28 DIAGNOSIS — S31114A Laceration without foreign body of abdominal wall, left lower quadrant without penetration into peritoneal cavity, initial encounter: Secondary | ICD-10-CM | POA: Diagnosis not present

## 2016-04-28 HISTORY — DX: Anxiety disorder, unspecified: F41.9

## 2016-04-28 LAB — CBC WITH DIFFERENTIAL/PLATELET
BASOS ABS: 0 10*3/uL (ref 0.0–0.1)
BASOS PCT: 0 %
Eosinophils Absolute: 0.3 10*3/uL (ref 0.0–0.7)
Eosinophils Relative: 3 %
HEMATOCRIT: 42.9 % (ref 39.0–52.0)
HEMOGLOBIN: 14 g/dL (ref 13.0–17.0)
Lymphocytes Relative: 14 %
Lymphs Abs: 1.3 10*3/uL (ref 0.7–4.0)
MCH: 30 pg (ref 26.0–34.0)
MCHC: 32.6 g/dL (ref 30.0–36.0)
MCV: 92.1 fL (ref 78.0–100.0)
MONO ABS: 0.6 10*3/uL (ref 0.1–1.0)
Monocytes Relative: 7 %
NEUTROS ABS: 7.3 10*3/uL (ref 1.7–7.7)
NEUTROS PCT: 76 %
Platelets: 232 10*3/uL (ref 150–400)
RBC: 4.66 MIL/uL (ref 4.22–5.81)
RDW: 13.7 % (ref 11.5–15.5)
WBC: 9.5 10*3/uL (ref 4.0–10.5)

## 2016-04-28 LAB — I-STAT CHEM 8, ED
BUN: 17 mg/dL (ref 6–20)
Calcium, Ion: 1.18 mmol/L (ref 1.12–1.23)
Chloride: 106 mmol/L (ref 101–111)
Creatinine, Ser: 1.4 mg/dL — ABNORMAL HIGH (ref 0.61–1.24)
Glucose, Bld: 95 mg/dL (ref 65–99)
HEMATOCRIT: 44 % (ref 39.0–52.0)
HEMOGLOBIN: 15 g/dL (ref 13.0–17.0)
Potassium: 4 mmol/L (ref 3.5–5.1)
SODIUM: 146 mmol/L — AB (ref 135–145)
TCO2: 27 mmol/L (ref 0–100)

## 2016-04-28 MED ORDER — IOPAMIDOL (ISOVUE-300) INJECTION 61%
INTRAVENOUS | Status: AC
  Administered 2016-04-28: 100 mL
  Filled 2016-04-28: qty 100

## 2016-04-28 MED ORDER — IBUPROFEN 800 MG PO TABS
800.0000 mg | ORAL_TABLET | Freq: Once | ORAL | Status: AC
  Administered 2016-04-28: 800 mg via ORAL
  Filled 2016-04-28: qty 1

## 2016-04-28 MED ORDER — HYDROCODONE-ACETAMINOPHEN 5-325 MG PO TABS
1.0000 | ORAL_TABLET | Freq: Once | ORAL | Status: AC
  Administered 2016-04-28: 1 via ORAL
  Filled 2016-04-28: qty 1

## 2016-04-28 MED ORDER — IBUPROFEN 800 MG PO TABS: 800.0000 mg | ORAL_TABLET | Freq: Three times a day (TID) | ORAL | Status: AC
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
## 2016-04-28 NOTE — ED Notes (Signed)
Pt. Verbalizes understanding of d/c instructions and displays no s/s of distress at this time. VS stable. Pt. Ambulatory out of the unit with steady gait.     
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
## 2016-04-28 NOTE — Discharge Instructions (Signed)
Laceration Care, Adult  A laceration is a cut that goes through all layers of the skin. The cut also goes into the tissue that is right under the skin. Some cuts heal on their own. Others need to be closed with stitches (sutures), staples, skin adhesive strips, or wound glue. Taking care of your cut lowers your risk of infection and helps your cut to heal better.  HOW TO TAKE CARE OF YOUR CUT  For stitches or staples:  · Keep the wound clean and dry.  · If you were given a bandage (dressing), you should change it at least one time per day or as told by your doctor. You should also change it if it gets wet or dirty.  · Keep the wound completely dry for the first 24 hours or as told by your doctor. After that time, you may take a shower or a bath. However, make sure that the wound is not soaked in water until after the stitches or staples have been removed.  · Clean the wound one time each day or as told by your doctor:    Wash the wound with soap and water.    Rinse the wound with water until all of the soap comes off.    Pat the wound dry with a clean towel. Do not rub the wound.  · After you clean the wound, put a thin layer of antibiotic ointment on it as told by your doctor. This ointment:    Helps to prevent infection.    Keeps the bandage from sticking to the wound.  · Have your stitches or staples removed as told by your doctor.  If your doctor used skin adhesive strips:   · Keep the wound clean and dry.  · If you were given a bandage, you should change it at least one time per day or as told by your doctor. You should also change it if it gets dirty or wet.  · Do not get the skin adhesive strips wet. You can take a shower or a bath, but be careful to keep the wound dry.  · If the wound gets wet, pat it dry with a clean towel. Do not rub the wound.  · Skin adhesive strips fall off on their own. You can trim the strips as the wound heals. Do not remove any strips that are still stuck to the wound. They will  fall off after a while.  If your doctor used wound glue:  · Try to keep your wound dry, but you may briefly wet it in the shower or bath. Do not soak the wound in water, such as by swimming.  · After you take a shower or a bath, gently pat the wound dry with a clean towel. Do not rub the wound.  · Do not do any activities that will make you really sweaty until the skin glue has fallen off on its own.  · Do not apply liquid, cream, or ointment medicine to your wound while the skin glue is still on.  · If you were given a bandage, you should change it at least one time per day or as told by your doctor. You should also change it if it gets dirty or wet.  · If a bandage is placed over the wound, do not let the tape for the bandage touch the skin glue.  · Do not pick at the glue. The skin glue usually stays on for 5-10 days. Then, it   falls off of the skin.  General Instructions   · To help prevent scarring, make sure to cover your wound with sunscreen whenever you are outside after stitches are removed, after adhesive strips are removed, or when wound glue stays in place and the wound is healed. Make sure to wear a sunscreen of at least 30 SPF.  · Take over-the-counter and prescription medicines only as told by your doctor.  · If you were given antibiotic medicine or ointment, take or apply it as told by your doctor. Do not stop using the antibiotic even if your wound is getting better.  · Do not scratch or pick at the wound.  · Keep all follow-up visits as told by your doctor. This is important.  · Check your wound every day for signs of infection. Watch for:    Redness, swelling, or pain.    Fluid, blood, or pus.  · Raise (elevate) the injured area above the level of your heart while you are sitting or lying down, if possible.  GET HELP IF:  · You got a tetanus shot and you have any of these problems at the injection site:    Swelling.    Very bad pain.    Redness.    Bleeding.  · You have a fever.  · A wound that was  closed breaks open.  · You notice a bad smell coming from your wound or your bandage.  · You notice something coming out of the wound, such as wood or glass.  · Medicine does not help your pain.  · You have more redness, swelling, or pain at the site of your wound.  · You have fluid, blood, or pus coming from your wound.  · You notice a change in the color of your skin near your wound.  · You need to change the bandage often because fluid, blood, or pus is coming from the wound.  · You start to have a new rash.  · You start to have numbness around the wound.  GET HELP RIGHT AWAY IF:  · You have very bad swelling around the wound.  · Your pain suddenly gets worse and is very bad.  · You notice painful lumps near the wound or on skin that is anywhere on your body.  · You have a red streak going away from your wound.  · The wound is on your hand or foot and you cannot move a finger or toe like you usually can.  · The wound is on your hand or foot and you notice that your fingers or toes look pale or bluish.     This information is not intended to replace advice given to you by your health care provider. Make sure you discuss any questions you have with your health care provider.     Document Released: 06/03/2008 Document Revised: 05/02/2015 Document Reviewed: 12/12/2014  [employer]vier Interactive Patient Education ©2016 [employer]
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
## 2016-04-29 ENCOUNTER — Encounter (HOSPITAL_COMMUNITY): Payer: Self-pay
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
